# Patient Record
Sex: Female | Born: 1935
Health system: Southern US, Community
[De-identification: ages and names within clinical notes are randomized; demographics above are authoritative.]

## PROBLEM LIST (undated history)

## (undated) DIAGNOSIS — I10 Essential (primary) hypertension: Secondary | ICD-10-CM

## (undated) DIAGNOSIS — I219 Acute myocardial infarction, unspecified: Secondary | ICD-10-CM

## (undated) DIAGNOSIS — C801 Malignant (primary) neoplasm, unspecified: Secondary | ICD-10-CM

## (undated) DIAGNOSIS — I2510 Atherosclerotic heart disease of native coronary artery without angina pectoris: Secondary | ICD-10-CM

## (undated) DIAGNOSIS — M329 Systemic lupus erythematosus, unspecified: Secondary | ICD-10-CM

## (undated) HISTORY — PX: REPLACEMENT TOTAL KNEE: SUR1224

## (undated) HISTORY — PX: CARDIAC CATHETERIZATION: SHX172

## (undated) HISTORY — PX: ABDOMINAL HYSTERECTOMY: SHX81

## (undated) HISTORY — PX: JOINT REPLACEMENT: SHX530

## (undated) HISTORY — PX: APPENDECTOMY: SHX54

---

## 2017-04-09 ENCOUNTER — Emergency Department (HOSPITAL_BASED_OUTPATIENT_CLINIC_OR_DEPARTMENT_OTHER): Payer: Medicare Other

## 2017-04-09 ENCOUNTER — Encounter (HOSPITAL_BASED_OUTPATIENT_CLINIC_OR_DEPARTMENT_OTHER): Payer: Self-pay | Admitting: *Deleted

## 2017-04-09 ENCOUNTER — Emergency Department (HOSPITAL_BASED_OUTPATIENT_CLINIC_OR_DEPARTMENT_OTHER)
Admission: EM | Admit: 2017-04-09 | Discharge: 2017-04-09 | Disposition: A | Discharge status: Home / Self Care | Payer: Medicare Other | Attending: Emergency Medicine | Admitting: Emergency Medicine

## 2017-04-09 DIAGNOSIS — K5900 Constipation, unspecified: Secondary | ICD-10-CM | POA: Diagnosis present

## 2017-04-09 DIAGNOSIS — I252 Old myocardial infarction: Secondary | ICD-10-CM | POA: Diagnosis not present

## 2017-04-09 DIAGNOSIS — Z96659 Presence of unspecified artificial knee joint: Secondary | ICD-10-CM | POA: Insufficient documentation

## 2017-04-09 DIAGNOSIS — Z859 Personal history of malignant neoplasm, unspecified: Secondary | ICD-10-CM | POA: Diagnosis not present

## 2017-04-09 DIAGNOSIS — Z7982 Long term (current) use of aspirin: Secondary | ICD-10-CM | POA: Diagnosis not present

## 2017-04-09 DIAGNOSIS — I2510 Atherosclerotic heart disease of native coronary artery without angina pectoris: Secondary | ICD-10-CM | POA: Diagnosis not present

## 2017-04-09 DIAGNOSIS — I10 Essential (primary) hypertension: Secondary | ICD-10-CM | POA: Insufficient documentation

## 2017-04-09 DIAGNOSIS — Z79899 Other long term (current) drug therapy: Secondary | ICD-10-CM | POA: Insufficient documentation

## 2017-04-09 HISTORY — DX: Acute myocardial infarction, unspecified: I21.9

## 2017-04-09 HISTORY — DX: Atherosclerotic heart disease of native coronary artery without angina pectoris: I25.10

## 2017-04-09 HISTORY — DX: Essential (primary) hypertension: I10

## 2017-04-09 HISTORY — DX: Malignant (primary) neoplasm, unspecified: C80.1

## 2017-04-09 HISTORY — DX: Systemic lupus erythematosus, unspecified: M32.9

## 2017-04-09 MED ORDER — FLEET ENEMA 7-19 GM/118ML RE ENEM
1.0000 | ENEMA | Freq: Once | RECTAL | Status: AC
  Administered 2017-04-09: 1 via RECTAL
  Filled 2017-04-09: qty 1

## 2017-04-09 MED ORDER — SORBITOL 70 % SOLN
960.0000 mL | TOPICAL_OIL | Freq: Once | ORAL | Status: DC
  Filled 2017-04-09: qty 240

## 2017-04-09 MED ORDER — MAGNESIUM CITRATE PO SOLN
0.5000 | Freq: Once | ORAL | Status: AC
  Administered 2017-04-09: 0.5 via ORAL
  Filled 2017-04-09: qty 296

## 2017-04-09 MED ORDER — POLYETHYLENE GLYCOL 3350 17 G PO PACK: 17.0000 g | PACK | Freq: Every day | ORAL | 0 refills | Status: AC

## 2017-04-09 MED FILL — POLYETHYLENE GLYCOL 3350: 15 days supply | Qty: 255 | Fill #0

## 2017-04-09 NOTE — ED Triage Notes (Signed)
pt c/o constipated  X 1 week c/o abd pain

## 2017-04-09 NOTE — ED Notes (Signed)
Patient transported to X-ray 

## 2017-04-09 NOTE — ED Provider Notes (Signed)
[facility]PT [facility] Provider Note   CSN: [id number] Arrival date & time: 04/09/17  1302     History   Chief Complaint Chief Complaint  Patient presents with  . Constipation    HPI Shannon Briggs is a 81 y.o. female.  Pt presents to the ED today with constipation for the past week.  The pt has tried fiber, but that did not work.      Past Medical History:  Diagnosis Date  . Cancer ([place])   . Coronary artery disease   . Hypertension   . Lupus   . MI (myocardial infarction) ([place])     There are no active problems to display for this patient.   Past Surgical History:  Procedure Laterality Date  . ABDOMINAL HYSTERECTOMY    . APPENDECTOMY    . CARDIAC CATHETERIZATION    . JOINT REPLACEMENT    . REPLACEMENT TOTAL KNEE      OB History    No data available       Home Medications    Prior to Admission medications   Medication Sig Start Date End Date Taking? Authorizing Provider  aspirin 81 MG chewable tablet Chew by mouth daily.   Yes [provider]  atorvastatin (LIPITOR) 40 MG tablet Take 40 mg by mouth daily.   Yes [provider]  calcium citrate-vitamin D (CITRACAL+D) 315-200 MG-UNIT tablet Take 1 tablet by mouth 2 (two) times daily.   Yes [provider]  carvedilol (COREG) 12.5 MG tablet Take 12.5 mg by mouth 2 (two) times daily with a meal.   Yes [provider]  hydrochlorothiazide (MICROZIDE) 12.5 MG capsule Take 12.5 mg by mouth daily.   Yes [provider]  levothyroxine (SYNTHROID, LEVOTHROID) 112 MCG tablet Take 112 mcg by mouth daily before breakfast.   Yes [provider]  lisinopril (PRINIVIL,ZESTRIL) 40 MG tablet Take 40 mg by mouth daily.   Yes [provider]  magnesium oxide (MAG-OX) 400 MG tablet Take 400 mg by mouth daily.   Yes [provider]  Multiple Vitamin (MULTIVITAMIN) LIQD Take 5 mLs by mouth daily.   Yes [provider]  Omega-3 Fatty Acids (FISH  OIL) 1200 MG CPDR Take by mouth.   Yes [provider]  potassium chloride SA (K-DUR,KLOR-CON) 20 MEQ tablet Take 20 mEq by mouth 2 (two) times daily.   Yes [provider]  tiZANidine (ZANAFLEX) 2 MG tablet Take by mouth every 6 (six) hours as needed for muscle spasms.   Yes [provider]  traZODone (DESYREL) 100 MG tablet Take 100 mg by mouth at bedtime.   Yes [provider]  vitamin A 7500 UNIT capsule Take 7,500 Units by mouth daily.   Yes [provider]  Vitamin D, Ergocalciferol, (DRISDOL) 50000 units CAPS capsule Take 50,000 Units by mouth every 7 (seven) days.   Yes [provider]  polyethylene glycol (MIRALAX) packet Take 17 g by mouth daily. 04/09/17   Isla Pence, MD    Family History History reviewed. No pertinent family history.  Social History Social History  Substance Use Topics  . Smoking status: Never Smoker  . Smokeless tobacco: Never Used  . Alcohol use No     Allergies   Codeine   Review of Systems Review of Systems  Gastrointestinal: Positive for constipation.  All other systems reviewed and are negative.    Physical Exam Updated Vital Signs BP (!) 135/99   Pulse 76   Temp 98.3 F (36.8  C)   Resp 16   Ht 5\' 2"  (1.575 m)   Wt 88.5 kg (195 lb)   SpO2 100%   BMI 35.67 kg/m   Physical Exam  Constitutional: She is oriented to person, place, and time. She appears well-developed and well-nourished.  HENT:  Head: Normocephalic and atraumatic.  Right Ear: External ear normal.  Left Ear: External ear normal.  Nose: Nose normal.  Mouth/Throat: Oropharynx is clear and moist.  Eyes: Pupils are equal, round, and reactive to light. Conjunctivae and EOM are normal.  Neck: Normal range of motion. Neck supple.  Cardiovascular: Normal rate, regular rhythm, normal heart sounds and intact distal pulses.   Pulmonary/Chest: Effort normal and breath sounds normal.  Abdominal: Soft. Bowel sounds are  decreased.  Genitourinary: Rectum normal.  Genitourinary Comments: No fecal impaction  Musculoskeletal: Normal range of motion.  Neurological: She is alert and oriented to person, place, and time.  Skin: Skin is warm. Capillary refill takes less than 2 seconds.  Psychiatric: She has a normal mood and affect. Her behavior is normal. Judgment and thought content normal.  Nursing note and vitals reviewed.    ED Treatments / Results  Labs (all labs ordered are listed, but only abnormal results are displayed) Labs Reviewed - No data to display  EKG  EKG Interpretation None       Radiology Dg Abdomen 1 View  Result Date: 04/09/2017 CLINICAL DATA:  Abdominal pain and constipation for 2 weeks. EXAM: ABDOMEN - 1 VIEW COMPARISON:  None. FINDINGS: No evidence of dilated bowel loops. Moderate colonic stool burden noted. Surgical lip seen in left pelvis. Several pelvic phleboliths noted, but no definite radiopaque calculi identified. Aortic atherosclerosis. Lumbar spine degenerative changes and mild levoscoliosis also noted. IMPRESSION: No acute findings.  Moderate stool burden. Electronically Signed   By: Earle Gell M.D.   On: 04/09/2017 13:26    Procedures Procedures (including critical care time)  Medications Ordered in ED Medications  sorbitol, milk of mag, mineral oil, glycerin (SMOG) enema (960 mLs Rectal Not Given 04/09/17 1500)  magnesium citrate solution 0.5 Bottle (0.5 Bottles Oral Given 04/09/17 1459)  sodium phosphate (FLEET) 7-19 GM/118ML enema 1 enema (1 enema Rectal Given 04/09/17 1507)     Initial Impression / Assessment and Plan / ED Course  I have reviewed the triage vital signs and the nursing notes.  Pertinent labs & imaging results that were available during my care of the patient were reviewed by me and considered in my medical decision making (see chart for details).     Pt did have a bm with the enema.  She is feeling ready to go home.  She knows to return if  worse.  F/u with pcp.  Final Clinical Impressions(s) / ED Diagnoses   Final diagnoses:  Constipation, unspecified constipation type    New Prescriptions New Prescriptions   POLYETHYLENE GLYCOL (MIRALAX) PACKET    Take 17 g by mouth daily.     Isla Pence, MD 04/09/17 [phone]

## 2017-08-06 DEATH — deceased

## 2018-02-27 IMAGING — CR DG ABDOMEN 1V
2 series · 2 of 2 positions shown · non-contrast
Comparison: None.

CLINICAL DATA: Abdominal pain and constipation for 2 weeks.

EXAM:
ABDOMEN - 1 VIEW

[t abdomen supine (1 of 2)]
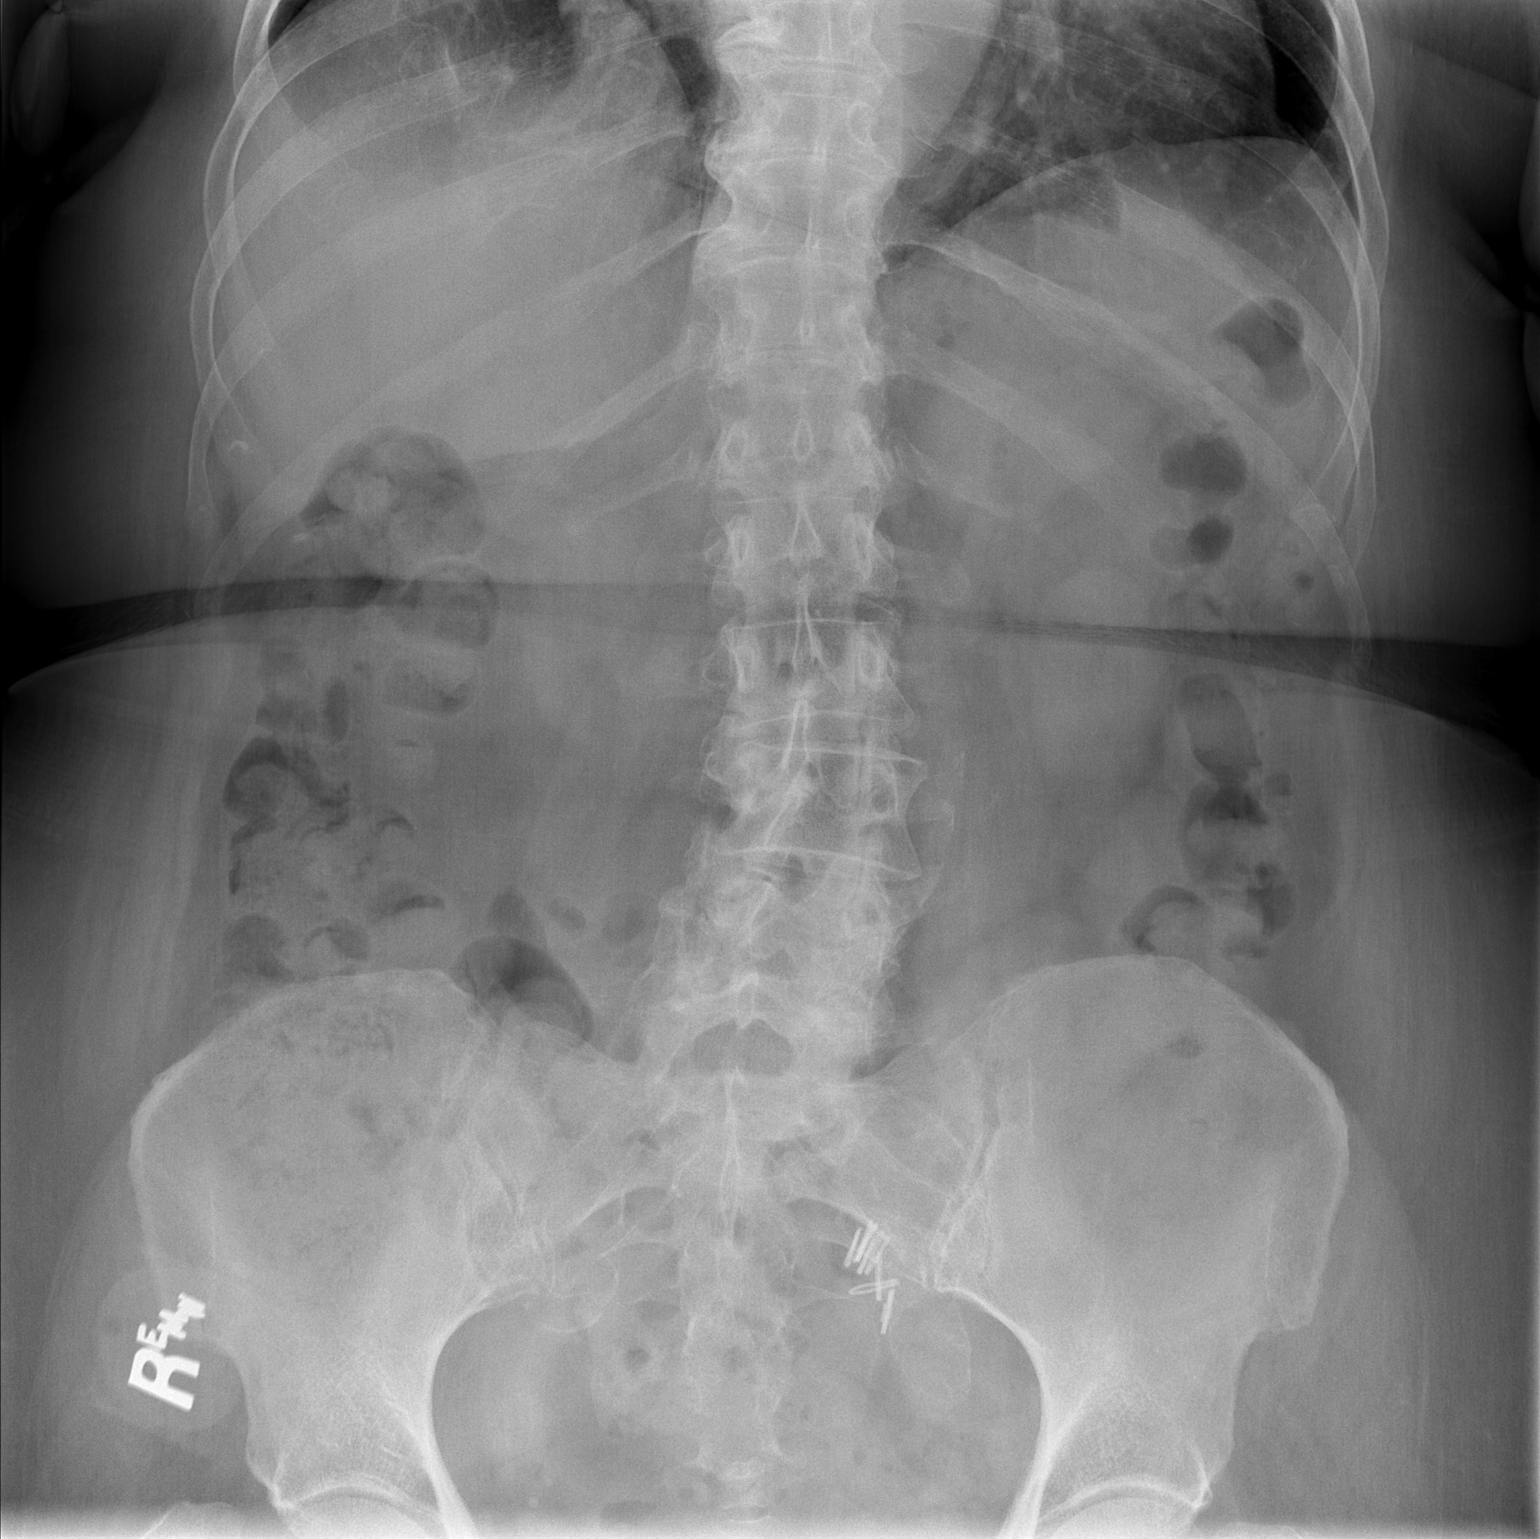

[t abdomen supine (2 of 2)]
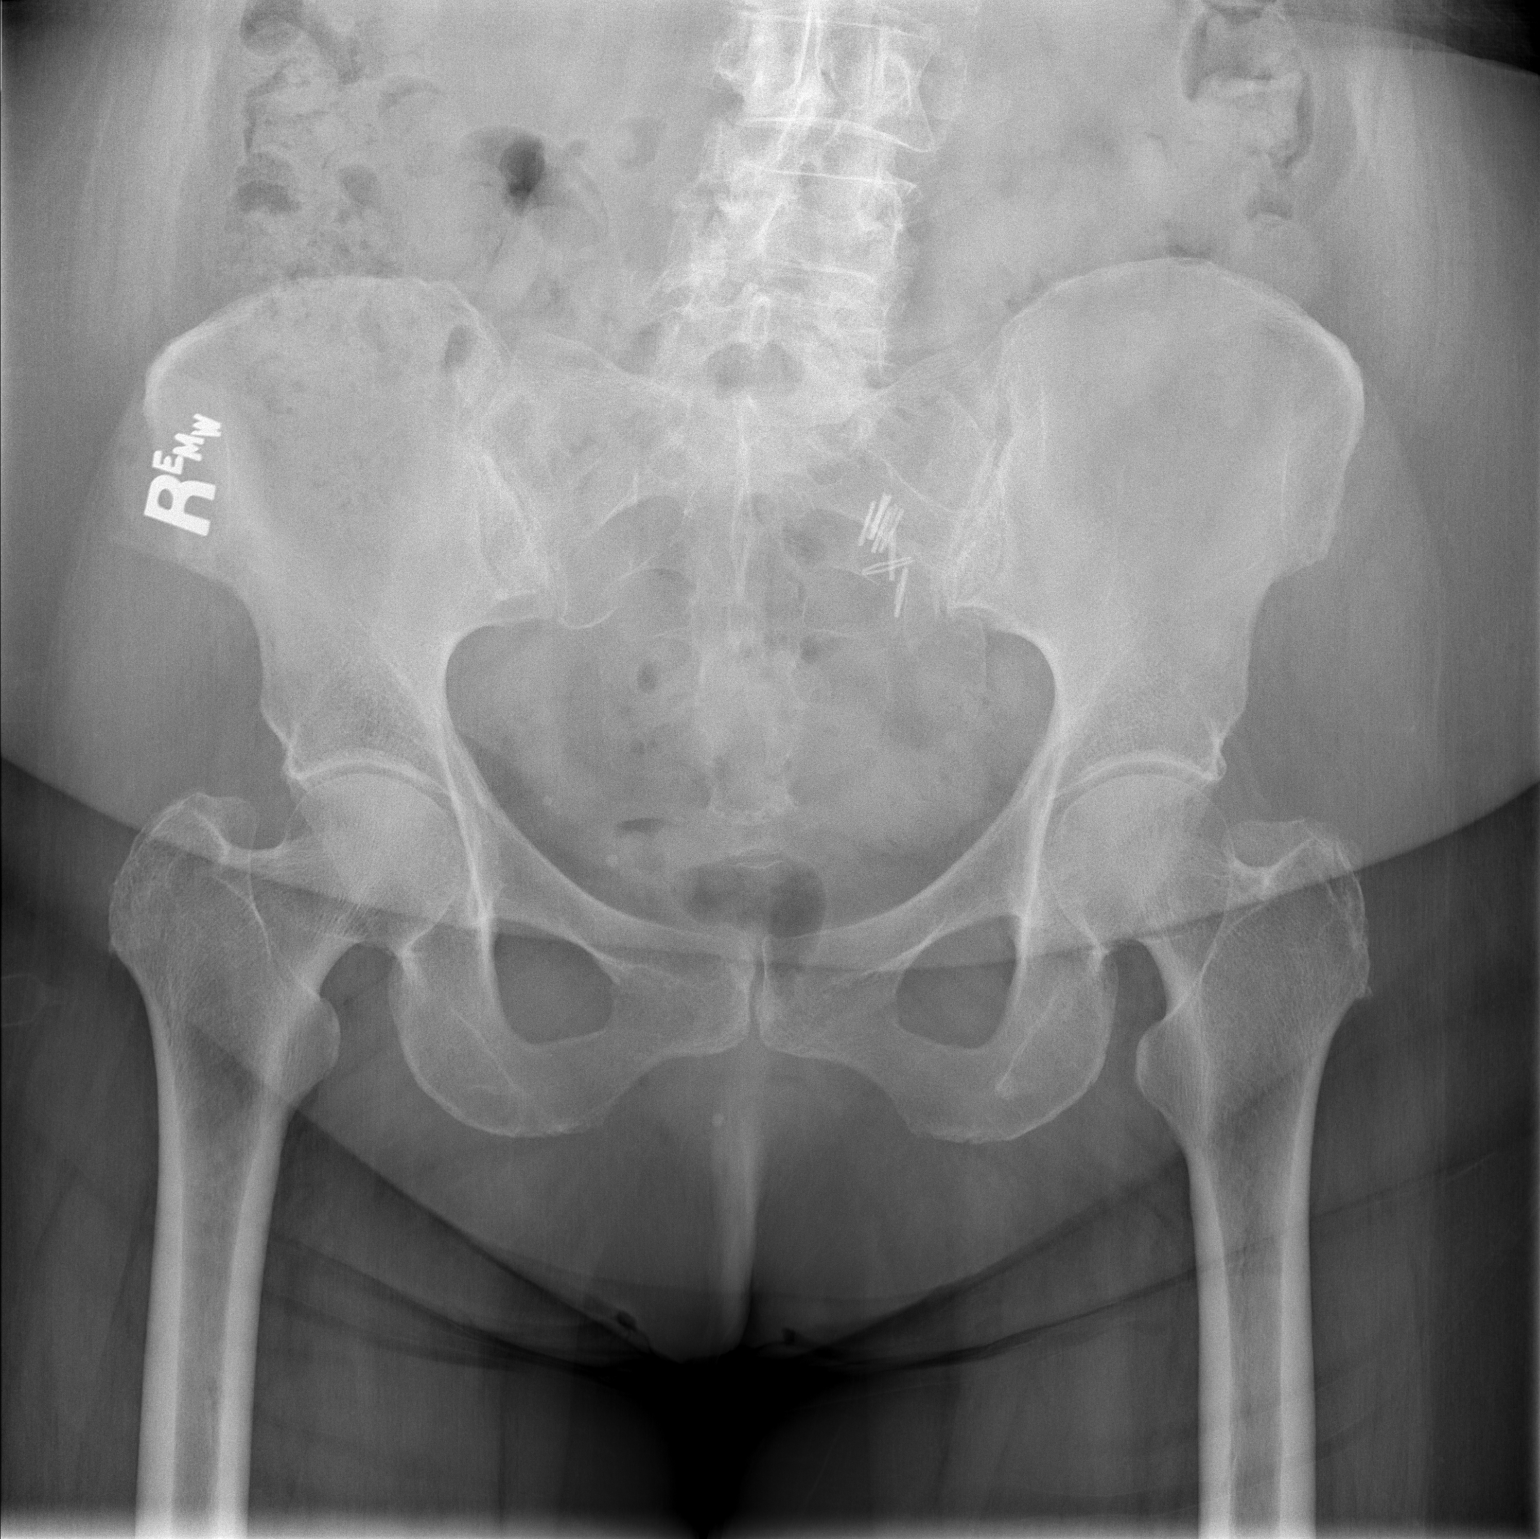

[2 of 2 positions shown; findings below may reference images not displayed]

FINDINGS: No evidence of dilated bowel loops. Moderate colonic stool burden
noted. Surgical lip seen in left pelvis. Several pelvic phleboliths
noted, but no definite radiopaque calculi identified. Aortic
atherosclerosis. Lumbar spine degenerative changes and mild
levoscoliosis also noted.
IMPRESSION: No acute findings.  Moderate stool burden.
